# Patient Record
Sex: Male | Born: 1965 | Race: White | Hispanic: No | Marital: Married | Smoking: Never smoker
Health system: Southern US, Community
[De-identification: ages and names within clinical notes are randomized; demographics above are authoritative.]

## PROBLEM LIST (undated history)

## (undated) DIAGNOSIS — K2970 Gastritis, unspecified, without bleeding: Secondary | ICD-10-CM

## (undated) DIAGNOSIS — R002 Palpitations: Secondary | ICD-10-CM

## (undated) DIAGNOSIS — B9681 Helicobacter pylori [H. pylori] as the cause of diseases classified elsewhere: Secondary | ICD-10-CM

## (undated) DIAGNOSIS — K222 Esophageal obstruction: Secondary | ICD-10-CM

## (undated) DIAGNOSIS — R0683 Snoring: Secondary | ICD-10-CM

## (undated) DIAGNOSIS — K219 Gastro-esophageal reflux disease without esophagitis: Secondary | ICD-10-CM

## (undated) HISTORY — DX: Gastritis, unspecified, without bleeding: K29.70

## (undated) HISTORY — DX: Helicobacter pylori (H. pylori) as the cause of diseases classified elsewhere: B96.81

## (undated) HISTORY — DX: Gastro-esophageal reflux disease without esophagitis: K21.9

## (undated) HISTORY — DX: Palpitations: R00.2

## (undated) HISTORY — DX: Esophageal obstruction: K22.2

## (undated) HISTORY — PX: ESOPHAGOGASTRODUODENOSCOPY: SHX1529

## (undated) HISTORY — DX: Snoring: R06.83

---

## 1988-05-18 DIAGNOSIS — D229 Melanocytic nevi, unspecified: Secondary | ICD-10-CM

## 1988-05-18 HISTORY — DX: Melanocytic nevi, unspecified: D22.9

## 1997-11-15 ENCOUNTER — Ambulatory Visit (HOSPITAL_COMMUNITY): Admission: RE | Admit: 1997-11-15 | Discharge: 1997-11-15 | Payer: Self-pay | Admitting: Gastroenterology

## 1999-04-28 ENCOUNTER — Ambulatory Visit (HOSPITAL_COMMUNITY): Admission: RE | Admit: 1999-04-28 | Discharge: 1999-04-28 | Payer: Self-pay | Admitting: Gastroenterology

## 1999-05-29 ENCOUNTER — Ambulatory Visit (HOSPITAL_COMMUNITY): Admission: RE | Admit: 1999-05-29 | Discharge: 1999-05-29 | Payer: Self-pay | Admitting: Gastroenterology

## 2000-06-17 ENCOUNTER — Encounter: Payer: Self-pay | Admitting: Family Medicine

## 2000-06-17 ENCOUNTER — Encounter: Admission: RE | Admit: 2000-06-17 | Discharge: 2000-06-17 | Payer: Self-pay | Admitting: Family Medicine

## 2000-11-19 ENCOUNTER — Encounter: Payer: Self-pay | Admitting: Family Medicine

## 2000-11-19 ENCOUNTER — Encounter: Admission: RE | Admit: 2000-11-19 | Discharge: 2000-11-19 | Payer: Self-pay | Admitting: Family Medicine

## 2004-01-14 ENCOUNTER — Ambulatory Visit (HOSPITAL_COMMUNITY): Admission: RE | Admit: 2004-01-14 | Discharge: 2004-01-14 | Payer: Self-pay | Admitting: Gastroenterology

## 2004-03-07 ENCOUNTER — Encounter: Admission: RE | Admit: 2004-03-07 | Discharge: 2004-03-07 | Payer: Self-pay | Admitting: Family Medicine

## 2004-08-11 ENCOUNTER — Encounter (INDEPENDENT_AMBULATORY_CARE_PROVIDER_SITE_OTHER): Payer: Self-pay | Admitting: *Deleted

## 2004-08-11 ENCOUNTER — Ambulatory Visit (HOSPITAL_BASED_OUTPATIENT_CLINIC_OR_DEPARTMENT_OTHER): Admission: RE | Admit: 2004-08-11 | Discharge: 2004-08-11 | Payer: Self-pay | Admitting: Surgery

## 2009-01-24 ENCOUNTER — Ambulatory Visit: Payer: Self-pay | Admitting: Internal Medicine

## 2009-01-24 DIAGNOSIS — K219 Gastro-esophageal reflux disease without esophagitis: Secondary | ICD-10-CM

## 2009-01-24 DIAGNOSIS — R0989 Other specified symptoms and signs involving the circulatory and respiratory systems: Secondary | ICD-10-CM

## 2009-01-24 DIAGNOSIS — R002 Palpitations: Secondary | ICD-10-CM | POA: Insufficient documentation

## 2009-01-24 DIAGNOSIS — R0609 Other forms of dyspnea: Secondary | ICD-10-CM

## 2009-01-25 ENCOUNTER — Encounter: Payer: Self-pay | Admitting: Internal Medicine

## 2009-01-31 ENCOUNTER — Ambulatory Visit: Payer: Self-pay

## 2009-01-31 ENCOUNTER — Telehealth: Payer: Self-pay | Admitting: Internal Medicine

## 2009-01-31 ENCOUNTER — Encounter: Payer: Self-pay | Admitting: Internal Medicine

## 2009-02-18 LAB — CONVERTED CEMR LAB
ALT: 24 units/L (ref 0–53)
AST: 17 units/L (ref 0–37)
Albumin: 4.6 g/dL (ref 3.5–5.2)
Alkaline Phosphatase: 44 units/L (ref 39–117)
BUN: 15 mg/dL (ref 6–23)
Basophils Absolute: 0 10*3/uL (ref 0.0–0.1)
Basophils Relative: 1 % (ref 0–1)
Bilirubin, Direct: 0.2 mg/dL (ref 0.0–0.3)
CO2: 22 meq/L (ref 19–32)
Calcium: 9.6 mg/dL (ref 8.4–10.5)
Chloride: 104 meq/L (ref 96–112)
Cholesterol: 152 mg/dL (ref 0–200)
Creatinine, Ser: 0.91 mg/dL (ref 0.40–1.50)
Eosinophils Absolute: 0.4 10*3/uL (ref 0.0–0.7)
Eosinophils Relative: 5 % (ref 0–5)
Free T4: 0.95 ng/dL (ref 0.80–1.80)
Glucose, Bld: 88 mg/dL (ref 70–99)
HCT: 45.5 % (ref 39.0–52.0)
HDL: 41 mg/dL (ref >39)
Hemoglobin: 16.3 g/dL (ref 13.0–17.0)
Indirect Bilirubin: 1.1 mg/dL — ABNORMAL HIGH (ref 0.0–0.9)
LDL Cholesterol: 84 mg/dL (ref 0–99)
Lymphocytes Relative: 36 % (ref 12–46)
Lymphs Abs: 2.5 10*3/uL (ref 0.7–4.0)
MCHC: 35.8 g/dL (ref 30.0–36.0)
MCV: 89.6 fL (ref 78.0–100.0)
Monocytes Absolute: 0.5 10*3/uL (ref 0.1–1.0)
Monocytes Relative: 8 % (ref 3–12)
Neutro Abs: 3.6 10*3/uL (ref 1.7–7.7)
Neutrophils Relative %: 51 % (ref 43–77)
Platelets: 219 10*3/uL (ref 150–400)
Potassium: 4.3 meq/L (ref 3.5–5.3)
RBC: 5.08 M/uL (ref 4.22–5.81)
RDW: 12.2 % (ref 11.5–15.5)
Sodium: 140 meq/L (ref 135–145)
TSH: 2.628 microintl units/mL (ref 0.350–4.500)
Total Bilirubin: 1.3 mg/dL — ABNORMAL HIGH (ref 0.3–1.2)
Total CHOL/HDL Ratio: 3.7
Total Protein: 6.9 g/dL (ref 6.0–8.3)
Triglycerides: 134 mg/dL (ref <150)
VLDL: 27 mg/dL (ref 0–40)
WBC: 7 10*3/uL (ref 4.0–10.5)

## 2009-03-11 ENCOUNTER — Encounter: Payer: Self-pay | Admitting: Cardiology

## 2009-03-11 ENCOUNTER — Ambulatory Visit (HOSPITAL_BASED_OUTPATIENT_CLINIC_OR_DEPARTMENT_OTHER): Admission: RE | Admit: 2009-03-11 | Discharge: 2009-03-11 | Payer: Self-pay | Admitting: Internal Medicine

## 2009-03-11 ENCOUNTER — Encounter: Payer: Self-pay | Admitting: Internal Medicine

## 2009-03-18 ENCOUNTER — Ambulatory Visit: Payer: Self-pay | Admitting: Pulmonary Disease

## 2009-03-18 ENCOUNTER — Telehealth: Payer: Self-pay | Admitting: Internal Medicine

## 2009-03-23 ENCOUNTER — Ambulatory Visit: Payer: Self-pay | Admitting: Cardiology

## 2009-03-23 ENCOUNTER — Encounter: Payer: Self-pay | Admitting: Cardiology

## 2009-03-23 ENCOUNTER — Ambulatory Visit: Payer: Self-pay | Admitting: Internal Medicine

## 2009-03-23 DIAGNOSIS — B070 Plantar wart: Secondary | ICD-10-CM

## 2009-03-30 ENCOUNTER — Telehealth: Payer: Self-pay | Admitting: Internal Medicine

## 2009-05-09 ENCOUNTER — Telehealth (INDEPENDENT_AMBULATORY_CARE_PROVIDER_SITE_OTHER): Payer: Self-pay | Admitting: *Deleted

## 2009-08-11 ENCOUNTER — Ambulatory Visit: Payer: Self-pay | Admitting: Internal Medicine

## 2009-08-11 DIAGNOSIS — A048 Other specified bacterial intestinal infections: Secondary | ICD-10-CM | POA: Insufficient documentation

## 2009-08-12 ENCOUNTER — Encounter (INDEPENDENT_AMBULATORY_CARE_PROVIDER_SITE_OTHER): Payer: Self-pay | Admitting: *Deleted

## 2009-08-12 ENCOUNTER — Telehealth: Payer: Self-pay | Admitting: Internal Medicine

## 2009-09-20 ENCOUNTER — Telehealth: Payer: Self-pay | Admitting: Internal Medicine

## 2010-08-01 NOTE — Letter (Signed)
Summary: New Patient letter  [facility]  [address] [place], [place] [address]   Phone: [phone]  Fax: [phone]       08/12/2009 MRN: [id number]  Joseph Gallagher [address] [place], [place]  [address]  Dear Mr. Joseph Gallagher,  Welcome to the Gastroenterology Division at [facility].    You are scheduled to see Dr. Russella Dar on 09/07/2009 at 8:45AM on the 3rd floor at [facility], [address]  We ask that you try to arrive at our office 15 minutes prior to your appointment time to allow for check-in.  We would like you to complete the enclosed self-administered evaluation form prior to your visit and bring it with you on the day of your appointment.  We will review it with you.  Also, please bring a complete list of all your medications or, if you prefer, bring the medication bottles and we will list them.  Please bring your insurance card so that we may make a copy of it.  If your insurance requires a referral to see a specialist, please bring your referral form from your primary care physician.  Co-payments are due at the time of your visit and may be paid by cash, check or credit card.     Your office visit will consist of a consult with your physician (includes a physical exam), any laboratory testing he/she may order, scheduling of any necessary diagnostic testing (e.g. x-ray, ultrasound, CT-scan), and scheduling of a procedure (e.g. Endoscopy, Colonoscopy) if required.  Please allow enough time on your schedule to allow for any/all of these possibilities.    If you cannot keep your appointment, please call [phone] to cancel or reschedule prior to your appointment date.  This allows [place] the opportunity to schedule an appointment for another patient in need of care.  If you do not cancel or reschedule by 5 p.m. the business day prior to your appointment date, you will be charged a $50.00 late cancellation/no-show fee.    Thank you for  choosing [facility] for your medical needs.  We appreciate the opportunity to care for you.  Please visit [place] at our website  to learn more about our practice.                     Sincerely,                                                             The Gastroenterology Division

## 2010-08-01 NOTE — Assessment & Plan Note (Signed)
Summary: NEW PT CPX-[employer] INSUR//CH   Vital Signs:  Patient profile:   45 year old male Height:      72 inches Weight:      222.75 pounds BMI:     30.32 Temp:     98.1 degrees F rectal Pulse rate:   72 / minute Pulse rhythm:   regular Resp:     16 per minute BP sitting:   118 / 72  (right arm) Cuff size:   large  Vitals Entered By: Glendell Docker CMA (January 24, 2009 1:31 PM)  Primary Care Provider:  Dondra Spry DO  CC:  New Patient -CPX and Palpitations.  History of Present Illness: New Patient to establish care CPX  Palpitations      This is a 45 year old man who presents with Palpitations.  The patient denies dizziness, presyncope, syncope, chest pain, and shortness of breath.  The patient denies the following symptoms: weakness, shortness of breath, and weight loss.  The palpitations are described as a sensation of the heart skipping beats and the heart beating strongly.  The palpitations are intermittent.  The palpitations are worse with caffeine use.    Preventive Screening-Counseling & Management  Alcohol-Tobacco     Alcohol drinks/day: 1 beer once a week      Alcohol type: beer     Alcohol Counseling: not indicated; use of alcohol is not excessive or problematic     Smoking Status: never     Tobacco Counseling: not indicated; no tobacco use  Caffeine-Diet-Exercise     Caffeine use/day: none     Caffeine Counseling: not indicated; caffeine use is not excessive or problematic     Does Patient Exercise: yes     Times/week: 3  Allergies (verified): No Known Drug Allergies  Past History:  Past Medical History: GERD Hx of arrhythmia Hx of esophageal stricture 2005 (Dr. Randa Evens)  Past Surgical History: EGD and salvary dilatation 12/2003 (Dr. 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         All other systems were reviewed and were negative.   Physical Exam  General:  alert, well-developed, and well-nourished.   Neck:  supple, no masses, no thyromegaly, and no carotid bruits.   Lungs:  normal respiratory effort, normal breath sounds, and no wheezes.   Heart:  normal rate, regular rhythm, no murmur, and no gallop.   Abdomen:  soft, non-tender, normal bowel sounds, no hepatomegaly, and no splenomegaly.   Extremities:  No lower extremity edema  Neurologic:  cranial nerves II-XII intact and gait normal.   Psych:  normally interactive, good eye contact, not anxious appearing, and not depressed appearing.     Impression & Recommendations:  Problem # 1:  HEALTH MAINTENANCE EXAM (ICD-V70.0)  Reviewed adult health maintenance protocols.  Td Booster: Tdap (01/02/2007)     Problem # 2:  PALPITATIONS, RECURRENT (ICD-785.1) Arrange further work up.  I suspect benign etiology.  I advised avoid all caffeine.   Orders: T-Basic Metabolic Panel [phone]) T-Lipid Profile [phone]) T-Hepatic Function [phone]) T-CBC w/Diff [phone]) T-TSH [id number]) T-T4, Free [phone]) Echo Referral (Echo)  Problem # 3:  SNORING (ICD-786.09)  Orders: Sleep Disorder Referral (Sleep Disorder)  Complete Medication List: 1)  Probiotic Caps (Misc intestinal flora regulat) .... Take 1 capsule by mouth once a day 2)  Aspirin Ec Low Dose 81 Mg Tbec (Aspirin) .... Take 1 tablet by mouth once a day 3)  Vitamin D3 2000 Unit Caps (Cholecalciferol) .... 2 caps by mouth once daily  Patient Instructions: 1)  Please schedule a follow-up appointment in 1 month.  Current Allergies (reviewed today): No known allergies    Preventive  Care Screening  Last Tetanus Booster:    Date:  01/02/2007    Results:  Tdap   PPD:    Date:  01/16/2005    Results:  negative

## 2010-08-01 NOTE — Progress Notes (Signed)
Summary: Re: F/u appt. with Dr.Shyane Fossum & H. Pylori test info.  Phone Note Call from Patient   Caller: Patient Summary of Call: I was going to set Joseph Gallagher up a f/u appt. with Dr.Smayan Hackbart, but patient wants to wait to find out when H-Pylori test is going to be. He was asking how and when that would be done & if someone would call and set that up for him.? I told him I would call him back with that info. and then set up a f/u appt.. Thank you, Victorino Dike Initial call taken by: Michaelle Copas,  August 12, 2009 10:21 AM  Follow-up for Phone Call        noted.  check status of GI referral for H Pylori breath test Follow-up by: D. Thomos Lemons DO,  August 12, 2009 12:30 PM  Additional Follow-up for Phone Call Additional follow up Details #1::        Pt. wants to wait and schedule f/u appt. with Dr.Anise Harbin until after he gets results from H Pylori.... Referral appt. has been set up for this test. Additional Follow-up by: Michaelle Copas,  August 12, 2009 2:34 PM

## 2010-08-01 NOTE — Assessment & Plan Note (Signed)
Summary: HEART BURN/HEA   Vital Signs:  Patient profile:   45 year old male Height:      72 inches Weight:      225.75 pounds BMI:     30.73 O2 Sat:      99 % on Room air Temp:     97.9 degrees F oral Pulse rate:   86 / minute BP sitting:   114 / 70  (right arm)  Vitals Entered By: Lucious Groves (August 11, 2009 4:13 PM)  O2 Flow:  Room air CC: C/O heartburn that is worse at night and has been causing chest discomfort and problems sleeping. Pt denies fever, and N&V./kb, Heartburn Comments Patient does have previous hx of H. Pylori infection. OTC items have been minimal help./kb   Primary Care Provider:  DThomos Lemons DO  CC:  C/O heartburn that is worse at night and has been causing chest discomfort and problems sleeping. Pt denies fever, and N&V./kb, and Heartburn.  History of Present Illness:  Heartburn      This is a 45 year old man who presents with Heartburn.  The patient reports acid reflux, but denies trouble swallowing and weight loss.  The patient denies the following alarm features: melena.  Symptoms are worse with spicy foods, aspirin, and lying down.   he has hx of h pylori infection diagnosed by antibody test he was has had EGDs in the past showing gastritis and esophagitis he was treated with 2 abx and ppi x 2 weeks years ago reflux symptoms getting worse recently he stopped PPI due to concerns of side effects  limited caffeine intake no soft drinks he drinks decaf tea no nsaid use but use aspirin  Current Medications (verified): 1)  Probiotic  Caps (Misc Intestinal Flora Regulat) .... Take 1 Capsule By Mouth Once A Day 2)  Aspirin Ec Low Dose 81 Mg Tbec (Aspirin) .... Take 1 Tablet By Mouth Once A Day 3)  Vitamin D3 2000 Unit Caps (Cholecalciferol) .... 2 Caps By Mouth Once Daily  Allergies (verified): No Known Drug Allergies  Past History:  Past Medical History: Current Problems:  PALPITATIONS, RECURRENT (ICD-785.1) SNORING (ICD-786.09) HEALTH  MAINTENANCE EXAM (ICD-V70.0)  FAMILY HISTORY BREAST CANCER 1ST DEGREE RELATIVE <50 (ICD-V16.3) GERD (ICD-530.81) Hx of esophageal stricture 2005 (Dr. Randa Evens) Hx of H. Pylori gastritis  (treated)  Past Surgical History: EGD and salvary dilatation 12/2003 (Dr. 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   Lungs:  normal respiratory effort and normal breath sounds.   Heart:  normal rate, regular rhythm, and no gallop.   Abdomen:  soft, non-tender, normal bowel sounds, no hepatomegaly, and no splenomegaly.     Impression & Recommendations:  Problem # 1:  HELICOBACTER PYLORI GASTRITIS (ICD-041.86) Pt was diagnosed with h. pylori by antibody test by his previous PCP.  he was treated x 2 weeks.  reflux symptoms improved but he is experiencing exacerbation.  Check breath test to see pt reinfected or tx failure.    restart nexium antireflux handout provided stop aspirin therapy  Orders: Misc. Referral (Misc. Ref)  Problem # 2:  PALPITATIONS, RECURRENT (ICD-785.1) never completed holter.  machine stopped working after 6 hrs.  prev echo - Left ventricle: The cavity  size was normal. Systolic function was     normal. The estimated ejection fraction was in the range of 55% to     60%. Wall motion was normal; there were no regional wall motion     abnormalities.   sleep study showed AIVR.   pt has infrequent symptoms.   continue to avoid caffeine.  consider b blocker if symptoms get worse  Complete Medication List: 1)  Probiotic Caps (Misc intestinal flora regulat) .... Take 1 capsule by mouth once a day 2)  Vitamin D3 2000 Unit Caps (Cholecalciferol) .... 2 caps by mouth once daily 3)  Nexium 40  Mg Cpdr (Esomeprazole magnesium) .... One by mouth once daily 15-30 mins before am meal  Patient Instructions: 1)  Please schedule a follow-up appointment in 2 months. Prescriptions: NEXIUM 40 MG CPDR (ESOMEPRAZOLE MAGNESIUM) one by mouth once daily 15-30 mins before AM meal  #30 x 3   Entered and Authorized by:   D. Thomos Lemons DO   Signed by:   D. Thomos Lemons DO on 08/11/2009   Method used:   Electronically to        [facility] [address] (retail)       [address] [place]       [place], [place]  [address]       Ph: [phone]       Fax: [phone]   RxID:   [id number]

## 2010-08-01 NOTE — Progress Notes (Signed)
  Phone Note Call from Patient   Caller: Patient Details for Reason: stopped caffine Summary of Call: Patient has stopped all caffine and heart burn has  improved  almost completely   wants to cancel appt with GI  Dr Russella Dar  call pt back   [phone] Initial call taken by: Darral Dash,  September 20, 2009 12:07 PM  Follow-up for Phone Call        noted Follow-up by: D. Thomos Lemons DO,  September 20, 2009 12:51 PM  Additional Follow-up for Phone Call Additional follow up Details #1::        Call to Pt      Appt cancelled    Additional Follow-up by: Darral Dash,  September 21, 2009 9:45 AM

## 2010-11-17 NOTE — Op Note (Signed)
NAME:  Joseph Gallagher, Joseph Gallagher                        ACCOUNT NO.:  [id number]   MEDICAL RECORD NO.:  [id number]                   PATIENT TYPE:  AMB   LOCATION:  [facility]                                 FACILITY:  [facility]   PHYSICIAN:  James L. Malon Kindle., M.D.          DATE OF BIRTH:  Jul 04, 1965   DATE OF PROCEDURE:  01/14/2004  DATE OF DISCHARGE:                                 OPERATIVE REPORT   PROCEDURE:  Esophagogastroduodenoscopy and Savary dilatation.   MEDICATIONS:  Cetacaine spray, fentanyl 100 mcg, Versed 10 mg IV.   INDICATIONS FOR PROCEDURE:  The patient has a previous history of esophageal  stricture with some increasing dysphagia.  This is done due to this known  history of previous stricture.   DESCRIPTION OF PROCEDURE:  The procedure had been explained to the patient  and consent obtained.  With the patient in the left lateral decubitus  position, the Olympus scope was inserted and advanced.  The distal esophagus  was reached.  The patient gagged and wretched despite a large amount of  sedation.  He had a stricture above the small hiatal hernia.  The scope  easily passed.  A complete endoscopy was performed and was completely  normal.  The duodenal bulb and second duodenum were normal. The pyloric  channel and antrum were normal.  Fundus and cardia were seen well in the  retroflexed view and were normal.  The scope along the greater curve of the  stomach, the Savary guide wire was placed through the scope and the scope  withdrawn under fluoroscopic guidance over the guide wire.  With the  patient's head extended, I then placed 33 and 36 Savary dilators were  passed.  There was a small amount of heme with both dilators.  The 36  dilator and wire were withdrawn as a unit.  The scope was withdrawn.  The  patient tolerated the procedure well and there were no immediate  complications.   ASSESSMENT:  Esophageal stricture dilated to 36 French.  530.3  Plan routine  post dilatation  orders.  Will keep on a PPI daily.  Give a reflux  instruction sheet and see back in the office in three months.                                               James L. Malon Kindle., M.D.    Waldron Session  D:  01/14/2004  T:  01/14/2004  Job:  [id number]   cc:   L. Lupe Carney, M.D.  [address] [place]  [place] [address]  Fax: [phone]

## 2010-11-21 ENCOUNTER — Telehealth: Payer: Self-pay | Admitting: Internal Medicine

## 2010-11-21 DIAGNOSIS — K219 Gastro-esophageal reflux disease without esophagitis: Secondary | ICD-10-CM

## 2010-11-21 DIAGNOSIS — Z0000 Encounter for general adult medical examination without abnormal findings: Secondary | ICD-10-CM

## 2010-11-21 NOTE — Telephone Encounter (Signed)
He is off on 5-25 and would like to get his cpe labs at [facility] that day   I had to make his cpe appt out on June 16.  Please fax order

## 2010-11-24 NOTE — Telephone Encounter (Signed)
BMET, FLP - V70 Magnesium level - use GERD code

## 2010-11-24 NOTE — Telephone Encounter (Signed)
Lab orders entered  Call placed to patient at [phone], no answer. A detailed voice message was left informing patient lab orders have been placed.

## 2010-12-11 ENCOUNTER — Encounter: Payer: Self-pay | Admitting: Internal Medicine

## 2010-12-14 ENCOUNTER — Encounter: Payer: Self-pay | Admitting: Internal Medicine

## 2010-12-14 ENCOUNTER — Encounter: Payer: Self-pay | Admitting: Family Medicine

## 2010-12-18 ENCOUNTER — Other Ambulatory Visit: Payer: Self-pay | Admitting: Dermatology

## 2011-02-16 ENCOUNTER — Ambulatory Visit (INDEPENDENT_AMBULATORY_CARE_PROVIDER_SITE_OTHER): Payer: 59 | Admitting: Internal Medicine

## 2011-02-16 ENCOUNTER — Encounter: Payer: Self-pay | Admitting: Internal Medicine

## 2011-02-16 VITALS — BP 124/80 | HR 57 | Temp 97.9°F | Resp 16 | Ht 72.0 in | Wt 228.0 lb

## 2011-02-16 DIAGNOSIS — Z0000 Encounter for general adult medical examination without abnormal findings: Secondary | ICD-10-CM

## 2011-02-16 LAB — URINALYSIS
Hgb urine dipstick: NEGATIVE
Ketones, ur: NEGATIVE mg/dL
Nitrite: NEGATIVE
Protein, ur: NEGATIVE mg/dL
Urobilinogen, UA: 0.2 mg/dL (ref 0.0–1.0)

## 2011-02-16 LAB — CBC WITH DIFFERENTIAL/PLATELET
Basophils Relative: 0 % (ref 0–1)
HCT: 45.3 % (ref 39.0–52.0)
Hemoglobin: 16.4 g/dL (ref 13.0–17.0)
Lymphocytes Relative: 29 % (ref 12–46)
MCHC: 36.2 g/dL — ABNORMAL HIGH (ref 30.0–36.0)
Monocytes Absolute: 0.5 10*3/uL (ref 0.1–1.0)
Monocytes Relative: 9 % (ref 3–12)
Neutro Abs: 3.1 10*3/uL (ref 1.7–7.7)
Neutrophils Relative %: 56 % (ref 43–77)
RBC: 4.97 MIL/uL (ref 4.22–5.81)
WBC: 5.6 10*3/uL (ref 4.0–10.5)

## 2011-02-16 LAB — LIPID PANEL
Cholesterol: 130 mg/dL (ref 0–200)
LDL Cholesterol: 72 mg/dL (ref 0–99)
Total CHOL/HDL Ratio: 3.3 Ratio
Triglycerides: 90 mg/dL (ref <150)
VLDL: 18 mg/dL (ref 0–40)

## 2011-02-16 LAB — BASIC METABOLIC PANEL
Chloride: 104 mEq/L (ref 96–112)
Glucose, Bld: 95 mg/dL (ref 70–99)
Potassium: 4.1 mEq/L (ref 3.5–5.3)
Sodium: 140 mEq/L (ref 135–145)

## 2011-02-16 LAB — HEPATIC FUNCTION PANEL
AST: 20 U/L (ref 0–37)
Albumin: 4.5 g/dL (ref 3.5–5.2)
Alkaline Phosphatase: 46 U/L (ref 39–117)
Total Protein: 6.6 g/dL (ref 6.0–8.3)

## 2011-02-16 MED ORDER — CEPHALEXIN 500 MG PO CAPS: 500.0000 mg | ORAL_CAPSULE | Freq: Three times a day (TID) | ORAL | Status: AC

## 2011-02-16 NOTE — Patient Instructions (Signed)
Attempt valerian over the counter for sleep

## 2011-02-17 DIAGNOSIS — Z0000 Encounter for general adult medical examination without abnormal findings: Secondary | ICD-10-CM | POA: Insufficient documentation

## 2011-02-17 LAB — VITAMIN D 25 HYDROXY (VIT D DEFICIENCY, FRACTURES): Vit D, 25-Hydroxy: 30 ng/mL (ref 30–89)

## 2011-02-17 NOTE — Progress Notes (Signed)
  Subjective:    Patient ID: Joseph Gallagher, male    DOB: Jan 30, 1966, 45 y.o.   MRN: [id number]  HPI Pt presents to clinic for annual physical. Sustained laceration to right thumb 2 d ago. No bleeding, f/c or drainage. +surrounding erythema and st swelling. FROM of thumb. Has chronic insomnia s/p reportedly nl sleep study. Failed melatonin and otc sleep aids. No other complaints.  Past Medical History  Diagnosis Date  . Palpitations     recurrent  . Snoring   . GERD (gastroesophageal reflux disease)   . Esophageal stricture     history of  2005 ( Dr Carman Ching)  . Helicobacter pylori gastritis     treated   Past Surgical History  Procedure Date  . Esophagogastroduodenoscopy     and salavary dilatation 12/2003- Dr Carman Ching    reports that he has never smoked. He has never used smokeless tobacco. He reports that he drinks alcohol. He reports that he does not use illicit drugs. family history includes Breast cancer in an unspecified family member.  There is no history of Other. No Known Allergies     Review of Systems see hpi     Objective:   Physical Exam   Physical Exam  Nursing note and vitals reviewed. Constitutional: Appears well-developed and well-nourished. No distress.  HENT: perrl, eom grossly intact. Op clear. Head: Normocephalic and atraumatic.  Right Ear: External ear normal. Nl tm and canal Left Ear: External ear normal. nl tm and canal Eyes: Conjunctivae are normal. No scleral icterus.  Neck: Neck supple. Carotid bruit is not present.  Cardiovascular: Normal rate, regular rhythm and normal heart sounds.  Exam reveals no gallop and no friction rub.   No murmur heard. Pulmonary/Chest: Effort normal and breath sounds normal. No respiratory distress. He has no wheezes. no rales.  Abd: soft, nd, nt,+bs, no masses or organomegaly. Lymphadenopathy:    He has no cervical adenopathy.  Neurological:Alert.  Skin: Skin is warm and dry. Not diaphoretic.    Psychiatric: Has a normal mood and affect.      Assessment & Plan:

## 2011-02-17 NOTE — Assessment & Plan Note (Signed)
Nl exam. Obtain cpe labs. Attempt valerian otc for insomnia. Begin 7d course of keflex for thumb cellulitis. Tetanus utd. Followup if no improvement or worsening.

## 2011-03-07 ENCOUNTER — Other Ambulatory Visit: Payer: Self-pay | Admitting: Internal Medicine

## 2011-11-05 ENCOUNTER — Other Ambulatory Visit: Payer: Self-pay | Admitting: Dermatology

## 2012-02-18 ENCOUNTER — Encounter: Payer: 59 | Admitting: Internal Medicine

## 2012-02-22 ENCOUNTER — Encounter: Payer: 59 | Admitting: Internal Medicine

## 2012-04-07 ENCOUNTER — Encounter: Payer: Self-pay | Admitting: Internal Medicine

## 2012-04-07 ENCOUNTER — Ambulatory Visit (INDEPENDENT_AMBULATORY_CARE_PROVIDER_SITE_OTHER): Payer: Managed Care, Other (non HMO) | Admitting: Internal Medicine

## 2012-04-07 VITALS — BP 112/68 | HR 62 | Temp 98.2°F | Wt 240.0 lb

## 2012-04-07 DIAGNOSIS — N419 Inflammatory disease of prostate, unspecified: Secondary | ICD-10-CM

## 2012-04-07 MED ORDER — LEVOFLOXACIN 500 MG PO TABS: 500.0000 mg | ORAL_TABLET | Freq: Every day | ORAL | Status: DC

## 2012-04-07 NOTE — Patient Instructions (Addendum)
Please schedule fasting labs prior to your next physical Cbc, chem7, lipid, lft, tsh and psa-v70.0

## 2012-04-08 LAB — URINALYSIS, ROUTINE W REFLEX MICROSCOPIC
Hgb urine dipstick: NEGATIVE
Leukocytes, UA: NEGATIVE
Nitrite: NEGATIVE
Protein, ur: NEGATIVE mg/dL

## 2012-04-09 DIAGNOSIS — R350 Frequency of micturition: Secondary | ICD-10-CM | POA: Insufficient documentation

## 2012-04-09 LAB — URINE CULTURE: Colony Count: NO GROWTH

## 2012-04-09 NOTE — Progress Notes (Signed)
  Subjective:    Patient ID: Joseph Gallagher, male    DOB: Apr 13, 1966, 46 y.o.   MRN: [id number]  HPI patient presents to clinic for evaluation of urinary changes. approximate 2-3 week history of increased urinary frequency and nocturia. Denies fever chills, hematuria, dysuria or abdominal pain. Does have vague discomfort in the pelvic area it does not radiate. No alleviating or exacerbating factors. Taking no medication for the problem.  Past Medical History  Diagnosis Date  . Palpitations     recurrent  . Snoring   . GERD (gastroesophageal reflux disease)   . Esophageal stricture     history of  2005 ( Dr Carman Ching)  . Helicobacter pylori gastritis     treated   Past Surgical History  Procedure Date  . Esophagogastroduodenoscopy     and salavary dilatation 12/2003- Dr Carman Ching    reports that he has never smoked. He has never used smokeless tobacco. He reports that he drinks alcohol. He reports that he does not use illicit drugs. family history includes Breast cancer in an unspecified family member.  There is no history of Other. No Known Allergies   Review of Systems see history of present illness     Objective:   Physical Exam  Nursing note and vitals reviewed. Constitutional: He appears well-developed and well-nourished. No distress.  HENT:  Head: Normocephalic and atraumatic.  Eyes: No scleral icterus.  Neurological: He is alert.  Skin: He is not diaphoretic.  Psychiatric: He has a normal mood and affect.          Assessment & Plan:

## 2012-04-09 NOTE — Assessment & Plan Note (Signed)
Suspect prostatitis based on history. Obtain urinalysis and urine culture. Attempt ten-day course of Levaquin. Follow closely if no improvement or worsening.

## 2012-04-11 ENCOUNTER — Telehealth: Payer: Self-pay | Admitting: *Deleted

## 2012-04-11 NOTE — Telephone Encounter (Signed)
Pt called requesting results from urine test. Pt's urine cx came back negative for bacterial growth. Contacted pt and left a message on cell phone with results. Told pt to continue with his Levoquin therapy, if his sx did not change or became worse to contact our office.

## 2012-04-22 ENCOUNTER — Telehealth: Payer: Self-pay | Admitting: *Deleted

## 2012-04-22 DIAGNOSIS — N419 Inflammatory disease of prostate, unspecified: Secondary | ICD-10-CM

## 2012-04-22 MED ORDER — LEVOFLOXACIN 500 MG PO TABS: 500.0000 mg | ORAL_TABLET | Freq: Every day | ORAL | Status: DC

## 2012-04-22 NOTE — Telephone Encounter (Signed)
Ok to repeat abx course

## 2012-04-22 NOTE — Telephone Encounter (Signed)
Pt notified ok to repeat  Levoquin 1 tab po qd x 10 days. Rx sent to [facility] on [address].

## 2012-04-22 NOTE — Telephone Encounter (Signed)
Pt called stating that he has taken the 10 days of Levoquin. Still has sx, wants to know if he can get another 10 days of rx. If so, can we call into the [facility] on [facility] [address] [facility].

## 2012-04-25 ENCOUNTER — Ambulatory Visit (INDEPENDENT_AMBULATORY_CARE_PROVIDER_SITE_OTHER): Payer: Managed Care, Other (non HMO) | Admitting: Internal Medicine

## 2012-04-25 ENCOUNTER — Encounter: Payer: Self-pay | Admitting: Internal Medicine

## 2012-04-25 VITALS — BP 118/76 | HR 65 | Temp 98.1°F | Resp 12 | Ht 73.5 in | Wt 237.0 lb

## 2012-04-25 DIAGNOSIS — Z0000 Encounter for general adult medical examination without abnormal findings: Secondary | ICD-10-CM

## 2012-04-25 DIAGNOSIS — N419 Inflammatory disease of prostate, unspecified: Secondary | ICD-10-CM

## 2012-04-25 DIAGNOSIS — R21 Rash and other nonspecific skin eruption: Secondary | ICD-10-CM

## 2012-04-25 DIAGNOSIS — Z23 Encounter for immunization: Secondary | ICD-10-CM

## 2012-04-25 LAB — CBC WITH DIFFERENTIAL/PLATELET
Basophils Absolute: 0 10*3/uL (ref 0.0–0.1)
Basophils Relative: 1 % (ref 0–1)
Eosinophils Absolute: 0.6 10*3/uL (ref 0.0–0.7)
Eosinophils Relative: 11 % — ABNORMAL HIGH (ref 0–5)
MCH: 32.7 pg (ref 26.0–34.0)
MCHC: 36.4 g/dL — ABNORMAL HIGH (ref 30.0–36.0)
MCV: 89.8 fL (ref 78.0–100.0)
Platelets: 210 10*3/uL (ref 150–400)
RDW: 13.6 % (ref 11.5–15.5)

## 2012-04-25 LAB — HEPATIC FUNCTION PANEL
AST: 22 U/L (ref 0–37)
Albumin: 4.2 g/dL (ref 3.5–5.2)
Alkaline Phosphatase: 44 U/L (ref 39–117)
Total Bilirubin: 0.8 mg/dL (ref 0.3–1.2)

## 2012-04-25 LAB — BASIC METABOLIC PANEL
CO2: 27 mEq/L (ref 19–32)
Calcium: 9.1 mg/dL (ref 8.4–10.5)
Creat: 0.86 mg/dL (ref 0.50–1.35)

## 2012-04-25 LAB — LIPID PANEL
Cholesterol: 146 mg/dL (ref 0–200)
HDL: 38 mg/dL — ABNORMAL LOW (ref >39)
Triglycerides: 117 mg/dL (ref <150)

## 2012-04-25 MED ORDER — TRIAMCINOLONE ACETONIDE 0.1 % EX CREA: TOPICAL_CREAM | Freq: Two times a day (BID) | CUTANEOUS | Status: DC

## 2012-04-25 NOTE — Assessment & Plan Note (Signed)
Attempt triamcinolone to affected area. Followup if no improvement or worsening.

## 2012-04-25 NOTE — Patient Instructions (Addendum)
Please schedule fasting labs prior to next year's physical Cbc, chem7, lipid, lft, tsh, ua with reflex micro and psa-v70.0

## 2012-04-25 NOTE — Assessment & Plan Note (Signed)
Symptoms currently resolved with second course of antibiotic. Followup if symptoms recur

## 2012-04-25 NOTE — Progress Notes (Signed)
  Subjective:    Patient ID: Joseph Gallagher, male    DOB: [birth year], 46 y.o.   MRN: [id number]  HPI patient presents to clinic for annual physical. Recently treated for presumed prostatitis with course of Levaquin. Symptoms of urinary frequency nocturia and pelvic discomfort resolved with antibiotic. Several days after completion however symptoms began to return mildly had a second course of antibiotic was begun. Symptoms currently resolved. Notes chronic intermittent cracking and thickening of skin involving lateral edges of fingers as well as inflammation of umbilicus. No other rashes noted and no extensor surface involvement.  Past Medical History  Diagnosis Date  . Palpitations     recurrent  . Snoring   . GERD (gastroesophageal reflux disease)   . Esophageal stricture     history of  2005 ( Dr Carman Ching)  . Helicobacter pylori gastritis     treated   Past Surgical History  Procedure Date  . Esophagogastroduodenoscopy     and salavary dilatation 12/2003- Dr Carman Ching    reports that he has never smoked. He has never used smokeless tobacco. He reports that he does not drink alcohol or use illicit drugs. family history includes Breast cancer in an unspecified family member.  There is no history of Other. No Known Allergies   Review of Systems see history of present illness     Objective:   Physical Exam  Nursing note and vitals reviewed. Constitutional: He appears well-developed and well-nourished. No distress.  HENT:  Head: Normocephalic and atraumatic.  Right Ear: Tympanic membrane, external ear and ear canal normal.  Left Ear: Tympanic membrane, external ear and ear canal normal.  Nose: Nose normal.  Mouth/Throat: Oropharynx is clear and moist. No oropharyngeal exudate.  Eyes: Conjunctivae normal and EOM are normal. Pupils are equal, round, and reactive to light. No scleral icterus.  Neck: Neck supple. No thyromegaly present.  Cardiovascular: Normal rate,  regular rhythm, normal heart sounds and intact distal pulses.  Exam reveals no gallop and no friction rub.   No murmur heard. Pulmonary/Chest: Effort normal and breath sounds normal. No respiratory distress. He has no wheezes. He has no rales.  Abdominal: Soft. Bowel sounds are normal. He exhibits no distension and no mass. There is no hepatosplenomegaly. There is no tenderness. There is no rebound and no guarding.  Lymphadenopathy:    He has no cervical adenopathy.  Neurological: He is alert.  Skin: Skin is warm and dry. He is not diaphoretic.       Umbilicus mildly inflamed without drainage  Psychiatric: He has a normal mood and affect.          Assessment & Plan:

## 2012-04-25 NOTE — Assessment & Plan Note (Signed)
Normal exam. EKG obtained (repeated because of lead placement) demonstrates sinus bradycardia at a rate of 60. Normal intervals and axis. No evidence of acute ischemic change. Obtain CPE labs.

## 2012-04-28 ENCOUNTER — Ambulatory Visit (INDEPENDENT_AMBULATORY_CARE_PROVIDER_SITE_OTHER): Payer: Managed Care, Other (non HMO) | Admitting: Internal Medicine

## 2012-04-28 DIAGNOSIS — Z111 Encounter for screening for respiratory tuberculosis: Secondary | ICD-10-CM

## 2012-04-28 NOTE — Progress Notes (Signed)
  Subjective:    Patient ID: Joseph Gallagher, male    DOB: 06-23-1966, 46 y.o.   MRN: [id number]  HPI  The patient presented to the office after 48 hours for evaluation of PPD skin test placed on 04/25/12.  Review of Systems     Objective:   Physical Exam  No redness or induration noted.       Assessment & Plan:   Negative TB skin test.

## 2012-04-30 DIAGNOSIS — Z23 Encounter for immunization: Secondary | ICD-10-CM

## 2012-05-07 ENCOUNTER — Encounter: Payer: Self-pay | Admitting: *Deleted

## 2012-05-09 ENCOUNTER — Telehealth: Payer: Self-pay | Admitting: *Deleted

## 2012-05-09 DIAGNOSIS — N419 Inflammatory disease of prostate, unspecified: Secondary | ICD-10-CM

## 2012-05-09 MED ORDER — LEVOFLOXACIN 500 MG PO TABS: 500.0000 mg | ORAL_TABLET | Freq: Every day | ORAL | Status: DC

## 2012-05-09 NOTE — Telephone Encounter (Signed)
Pt requesting one more round of ABX [last Rx: Levaquin 500 mg [1] daily #10x0 10.22.13] for the weekend, states he has done [2] rounds already and is better; to [facility] pharmacy W-S/SLS Please advise.

## 2012-05-09 NOTE — Telephone Encounter (Signed)
Rx to pharmacy; pt informed/SLS 

## 2012-05-09 NOTE — Telephone Encounter (Signed)
Ok

## 2012-09-23 ENCOUNTER — Ambulatory Visit: Payer: Managed Care, Other (non HMO) | Admitting: Family

## 2012-09-23 ENCOUNTER — Telehealth: Payer: Self-pay | Admitting: Family

## 2012-09-23 ENCOUNTER — Encounter: Payer: Self-pay | Admitting: Family

## 2012-09-23 ENCOUNTER — Ambulatory Visit (HOSPITAL_BASED_OUTPATIENT_CLINIC_OR_DEPARTMENT_OTHER)
Admission: RE | Admit: 2012-09-23 | Discharge: 2012-09-23 | Disposition: A | Discharge status: Home / Self Care | Payer: Managed Care, Other (non HMO) | Source: Ambulatory Visit | Attending: Family | Admitting: Family

## 2012-09-23 ENCOUNTER — Telehealth: Payer: Self-pay | Admitting: *Deleted

## 2012-09-23 ENCOUNTER — Ambulatory Visit (INDEPENDENT_AMBULATORY_CARE_PROVIDER_SITE_OTHER): Payer: Managed Care, Other (non HMO) | Admitting: Family

## 2012-09-23 VITALS — BP 120/80 | HR 84 | Temp 99.8°F | Resp 18 | Wt 237.0 lb

## 2012-09-23 DIAGNOSIS — R05 Cough: Secondary | ICD-10-CM

## 2012-09-23 DIAGNOSIS — J322 Chronic ethmoidal sinusitis: Secondary | ICD-10-CM

## 2012-09-23 DIAGNOSIS — B070 Plantar wart: Secondary | ICD-10-CM

## 2012-09-23 DIAGNOSIS — R059 Cough, unspecified: Secondary | ICD-10-CM | POA: Insufficient documentation

## 2012-09-23 MED ORDER — BENZONATATE 100 MG PO CAPS: 100.0000 mg | ORAL_CAPSULE | Freq: Three times a day (TID) | ORAL | Status: DC | PRN

## 2012-09-23 MED ORDER — AMOXICILLIN-POT CLAVULANATE 875-125 MG PO TABS: 1.0000 | ORAL_TABLET | Freq: Two times a day (BID) | ORAL | Status: DC

## 2012-09-23 MED ORDER — PREDNISONE 10 MG PO TABS: ORAL_TABLET | ORAL | Status: DC

## 2012-09-23 MED ORDER — AMOXICILLIN-POT CLAVULANATE 600-42.9 MG/5ML PO SUSR: 600.0000 mg | Freq: Two times a day (BID) | ORAL | Status: DC

## 2012-09-23 NOTE — Assessment & Plan Note (Signed)
cxr negative for pneumonia. Suspect sinusitis which is aggravating cough.  Add short course of prednisone for cough and tessalon. rx Augmentin for sinusitis

## 2012-09-23 NOTE — Telephone Encounter (Signed)
New Rx phoned in to pharmacist, Dorene Sorrow, at [facility] with instructions on equivalent dosing [from oral to suspension]/SLS

## 2012-09-23 NOTE — Patient Instructions (Addendum)
Please complete chest x ray on the first floor. Call if symptoms worsen or do not improve in 3 days. Follow up in 1 week.

## 2012-09-23 NOTE — Telephone Encounter (Signed)
Reviewed CXR.  Negative for pneumonia. Plan rx with augmentin.  Pt aware and requests all rx be sent to [facility].  Cancelled rx at [facility] pharmacy.

## 2012-09-23 NOTE — Telephone Encounter (Signed)
OK to substitute liquid.

## 2012-09-23 NOTE — Progress Notes (Signed)
Subjective:    Patient ID: Joseph Gallagher, male    DOB: 11-Aug-1965, 47 y.o.   MRN: [id number]  HPI  Mr. Vitelli is a 47 yr old male who presents today with chief complaint of cough. Cough has been present x 14 days. He reports that symptoms started out like a cold with congestion.  Had associated laryngitis.  Resolved and then came back with a "vengence."  Cough is dry and hacking.  Non-productive. He does have some green mucous in his sinuses.  + pain "in my eye sockets."  He reports that he had a subjective fever this week which he described as chills. Did not check temperature.  He tried a family member's cough syrup with hydrocodone.   He has a plantar wart at the base of the right heel  Which he wishes to have frozen.   Review of Systems See HPI  Past Medical History  Diagnosis Date  . Palpitations     recurrent  . Snoring   . GERD (gastroesophageal reflux disease)   . Esophageal stricture     history of  2005 ( Dr Carman Ching)  . Helicobacter pylori gastritis     treated    History   Social History  . Marital Status: Married    Spouse Name: N/A    Number of Children: N/A  . Years of Education: N/A   Occupational History  . Not on file.   Social History Main Topics  . Smoking status: Never Smoker   . Smokeless tobacco: Never Used  . Alcohol Use: No  . Drug Use: No  . Sexually Active: Not on file   Other Topics Concern  . Not on file   Social History Narrative   Occupation:Plumber   Married 2.5 years (wife having issues with infertility)   She is neonatal PA   no children   Never Smoked    Alcohol use-yes          Past Surgical History  Procedure Laterality Date  . Esophagogastroduodenoscopy      and salavary dilatation 12/2003- Dr Carman Ching    Family History  Problem Relation Age of Onset  . Breast cancer    . Other Neg Hx     no preamature CAD    No Known Allergies  Current Outpatient Prescriptions on File Prior to Visit   Medication Sig Dispense Refill  . aspirin 81 MG tablet Take 81 mg by mouth daily.      . Cholecalciferol (VITAMIN D3) 2000 UNITS TABS Take by mouth. 2 capsules by mouth once daily      . Probiotic Product (PROBIOTIC ACIDOPHILUS PO) Take by mouth.         No current facility-administered medications on file prior to visit.    BP 120/80  Pulse 84  Temp(Src) 99.8 F (37.7 C) (Oral)  Resp 18  Wt 237 lb (107.502 kg)  BMI 30.84 kg/m2  SpO2 96%       Objective:   Physical Exam  Constitutional: He is oriented to person, place, and time. He appears well-developed and well-nourished. No distress.  HENT:  Head: Normocephalic and atraumatic.  Right Ear: Tympanic membrane and ear canal normal.  Left Ear: Tympanic membrane and ear canal normal.  Mouth/Throat: No oropharyngeal exudate, posterior oropharyngeal edema or posterior oropharyngeal erythema.  Cardiovascular: Normal rate and regular rhythm.   No murmur heard. Pulmonary/Chest: Effort normal and breath sounds normal. No respiratory distress. He has no wheezes. He has no rales.  He exhibits no tenderness.  Lymphadenopathy:    He has cervical adenopathy.  Neurological: He is alert and oriented to person, place, and time.  Psychiatric: He has a normal mood and affect. His behavior is normal. Judgment and thought content normal.  skin- small plantar wart noted on right inner heel.        Assessment & Plan:

## 2012-09-23 NOTE — Telephone Encounter (Signed)
Received message from pt that he forgot to mention he has difficulty swallowing larger pills and is requesting Rx for liquid augmentin to replace the tablets he was prescribed today.  Please advise.

## 2012-09-29 ENCOUNTER — Ambulatory Visit: Payer: Managed Care, Other (non HMO) | Admitting: Family

## 2012-10-08 ENCOUNTER — Encounter: Payer: Self-pay | Admitting: Internal Medicine

## 2012-10-08 ENCOUNTER — Ambulatory Visit (INDEPENDENT_AMBULATORY_CARE_PROVIDER_SITE_OTHER): Payer: Managed Care, Other (non HMO) | Admitting: Internal Medicine

## 2012-10-08 VITALS — BP 124/76 | HR 72 | Temp 98.1°F | Ht 73.5 in | Wt 233.0 lb

## 2012-10-08 DIAGNOSIS — R350 Frequency of micturition: Secondary | ICD-10-CM

## 2012-10-08 DIAGNOSIS — R1031 Right lower quadrant pain: Secondary | ICD-10-CM

## 2012-10-08 LAB — CBC WITH DIFFERENTIAL/PLATELET
Basophils Absolute: 0.1 10*3/uL (ref 0.0–0.1)
Basophils Relative: 0.7 % (ref 0.0–3.0)
Eosinophils Absolute: 0.3 10*3/uL (ref 0.0–0.7)
HCT: 44.8 % (ref 39.0–52.0)
Hemoglobin: 15.3 g/dL (ref 13.0–17.0)
Lymphs Abs: 2.1 10*3/uL (ref 0.7–4.0)
MCHC: 34 g/dL (ref 30.0–36.0)
MCV: 92.3 fl (ref 78.0–100.0)
Monocytes Absolute: 0.5 10*3/uL (ref 0.1–1.0)
Neutro Abs: 4.7 10*3/uL (ref 1.4–7.7)
RBC: 4.86 Mil/uL (ref 4.22–5.81)
RDW: 12.6 % (ref 11.5–14.6)

## 2012-10-08 LAB — POCT URINALYSIS DIPSTICK
Ketones, UA: NEGATIVE
Protein, UA: NEGATIVE
Spec Grav, UA: 1.025
Urobilinogen, UA: 0.2
pH, UA: 7

## 2012-10-08 LAB — BASIC METABOLIC PANEL
CO2: 30 mEq/L (ref 19–32)
Chloride: 101 mEq/L (ref 96–112)
Glucose, Bld: 88 mg/dL (ref 70–99)
Sodium: 138 mEq/L (ref 135–145)

## 2012-10-08 NOTE — Assessment & Plan Note (Signed)
Patient experiencing unexplained bouts of urinary frequency. His symptoms presumed secondary to prostatitis. He finished 2 courses of Levaquin. Patient had recurrence of symptoms recently but improved after taking Augmentin and prednisone. Unclear whether patient's symptoms are secondary to chronic prostatitis vs other etiology. Digital rectal exam is normal. He has unexplained right lower quadrant pain. Check CBCD and sedimentation rate. If abnormal, we discussed obtaining CT of abdomen and pelvis to rule out chronic appendicitis.

## 2012-10-08 NOTE — Progress Notes (Signed)
Subjective:    Patient ID: Joseph Gallagher, male    DOB: April 20, 1966, 47 y.o.   MRN: [id number]  HPI  47 year old white male with history of palpitations, gastroesophageal reflux disease and H. Pylori gastritis to reestablish. Patient was seen by Dr. Rodena Medin in October of 2013 secondary complaints of frequent urination. Presumptive diagnosis of prostatitis was made. His PSA was normal. Patient reports being treated with Levaquin for 10 days. His symptoms initially improved and then restarted so he was prescribed Levaquin for additional 10 days. His symptoms seem to get better for several months but recently his symptoms restarted.   He was seen by nurse practitioner for possible sinusitis. He was treated with combination of Augmentin and prednisone. He experience severe diarrhea while taking Augmentin. Fortunately his diarrhea resolved on its own. It has been 4 days since his last episode.  His urinary complaints also improved.  He is not experiencing any dysuria along with urinary frequency. He did not have any problems starting or stopping history. He reports mild dribbling at the end of voiding.  Review of Systems   Constitutional: Negative for activity change, appetite change and unexpected weight change. negative for fever or chills Eyes: Negative for visual disturbance.  Respiratory: Negative for cough, chest tightness and shortness of breath.   Cardiovascular: Negative for chest pain.  Genitourinary: Negative for painful urination.  Neurological: Negative for headaches.  Gastrointestinal: intermittent RLQ abdominal pain  Psych: Negative for depression or anxiety      Past Medical History  Diagnosis Date  . Palpitations     recurrent  . Snoring   . GERD (gastroesophageal reflux disease)   . Esophageal stricture     history of  2005 ( Dr Carman Ching)  . Helicobacter pylori gastritis     treated    History   Social History  . Marital Status: Married    Spouse Name: N/A     Number of Children: N/A  . Years of Education: N/A   Occupational History  . Not on file.   Social History Main Topics  . Smoking status: Never Smoker   . Smokeless tobacco: Never Used  . Alcohol Use: No  . Drug Use: No  . Sexually Active: Not on file   Other Topics Concern  . Not on file   Social History Narrative   Occupation:Plumber   Married 2.5 years (wife having issues with infertility)   She is neonatal PA   no children   Never Smoked    Alcohol use-yes          Past Surgical History  Procedure Laterality Date  . Esophagogastroduodenoscopy      and salavary dilatation 12/2003- Dr Carman Ching    Family History  Problem Relation Age of Onset  . Breast cancer    . Other Neg Hx     no preamature CAD    No Known Allergies  Current Outpatient Prescriptions on File Prior to Visit  Medication Sig Dispense Refill  . aspirin 81 MG tablet Take 81 mg by mouth daily.      . Cholecalciferol (VITAMIN D3) 2000 UNITS TABS Take by mouth. 2 capsules by mouth once daily      . Probiotic Product (PROBIOTIC ACIDOPHILUS PO) Take by mouth.         No current facility-administered medications on file prior to visit.    BP 124/76  Pulse 72  Temp(Src) 98.1 F (36.7 C) (Oral)  Ht 6' 1.5" (1.867 m)  Wt  233 lb (105.688 kg)  BMI 30.32 kg/m2       Objective:   Physical Exam  Constitutional: He is oriented to person, place, and time. He appears well-developed and well-nourished.  HENT:  Head: Normocephalic and atraumatic.  Right Ear: External ear normal.  Left Ear: External ear normal.  Mouth/Throat: Oropharynx is clear and moist.  Neck: Neck supple.  Cardiovascular: Normal rate, regular rhythm and normal heart sounds.   Pulmonary/Chest: Effort normal and breath sounds normal. He has no wheezes.  Abdominal: Soft. Bowel sounds are normal. He exhibits no mass. There is no rebound.  RLQ tenderness  Genitourinary: Guaiac negative stool.  External hemorrhoids,  prostate is normal size and contour, no asymmetry or nodules  Lymphadenopathy:    He has no cervical adenopathy.  Neurological: He is alert and oriented to person, place, and time. No cranial nerve deficit.  Skin: Skin is warm and dry.  Psychiatric: He has a normal mood and affect. His behavior is normal.          Assessment & Plan:

## 2012-10-08 NOTE — Patient Instructions (Addendum)
Contact our office if your urinary symptoms recur or you develop abdominal pain and fever

## 2012-10-09 ENCOUNTER — Ambulatory Visit: Payer: Managed Care, Other (non HMO) | Admitting: Family Medicine

## 2012-10-09 ENCOUNTER — Encounter: Payer: Self-pay | Admitting: Internal Medicine

## 2013-05-07 ENCOUNTER — Other Ambulatory Visit: Payer: Self-pay

## 2013-05-18 ENCOUNTER — Other Ambulatory Visit: Payer: Self-pay | Admitting: Dermatology

## 2013-05-18 DIAGNOSIS — C439 Malignant melanoma of skin, unspecified: Secondary | ICD-10-CM

## 2013-05-18 HISTORY — DX: Malignant melanoma of skin, unspecified: C43.9

## 2013-06-19 ENCOUNTER — Other Ambulatory Visit: Payer: Self-pay | Admitting: Dermatology

## 2013-10-18 IMAGING — CR DG CHEST 2V
2 series · 2 of 2 positions shown · non-contrast
Comparison: None.

CLINICAL DATA: 14-day history of cough

CHEST - 2 VIEW

[w chest pa]
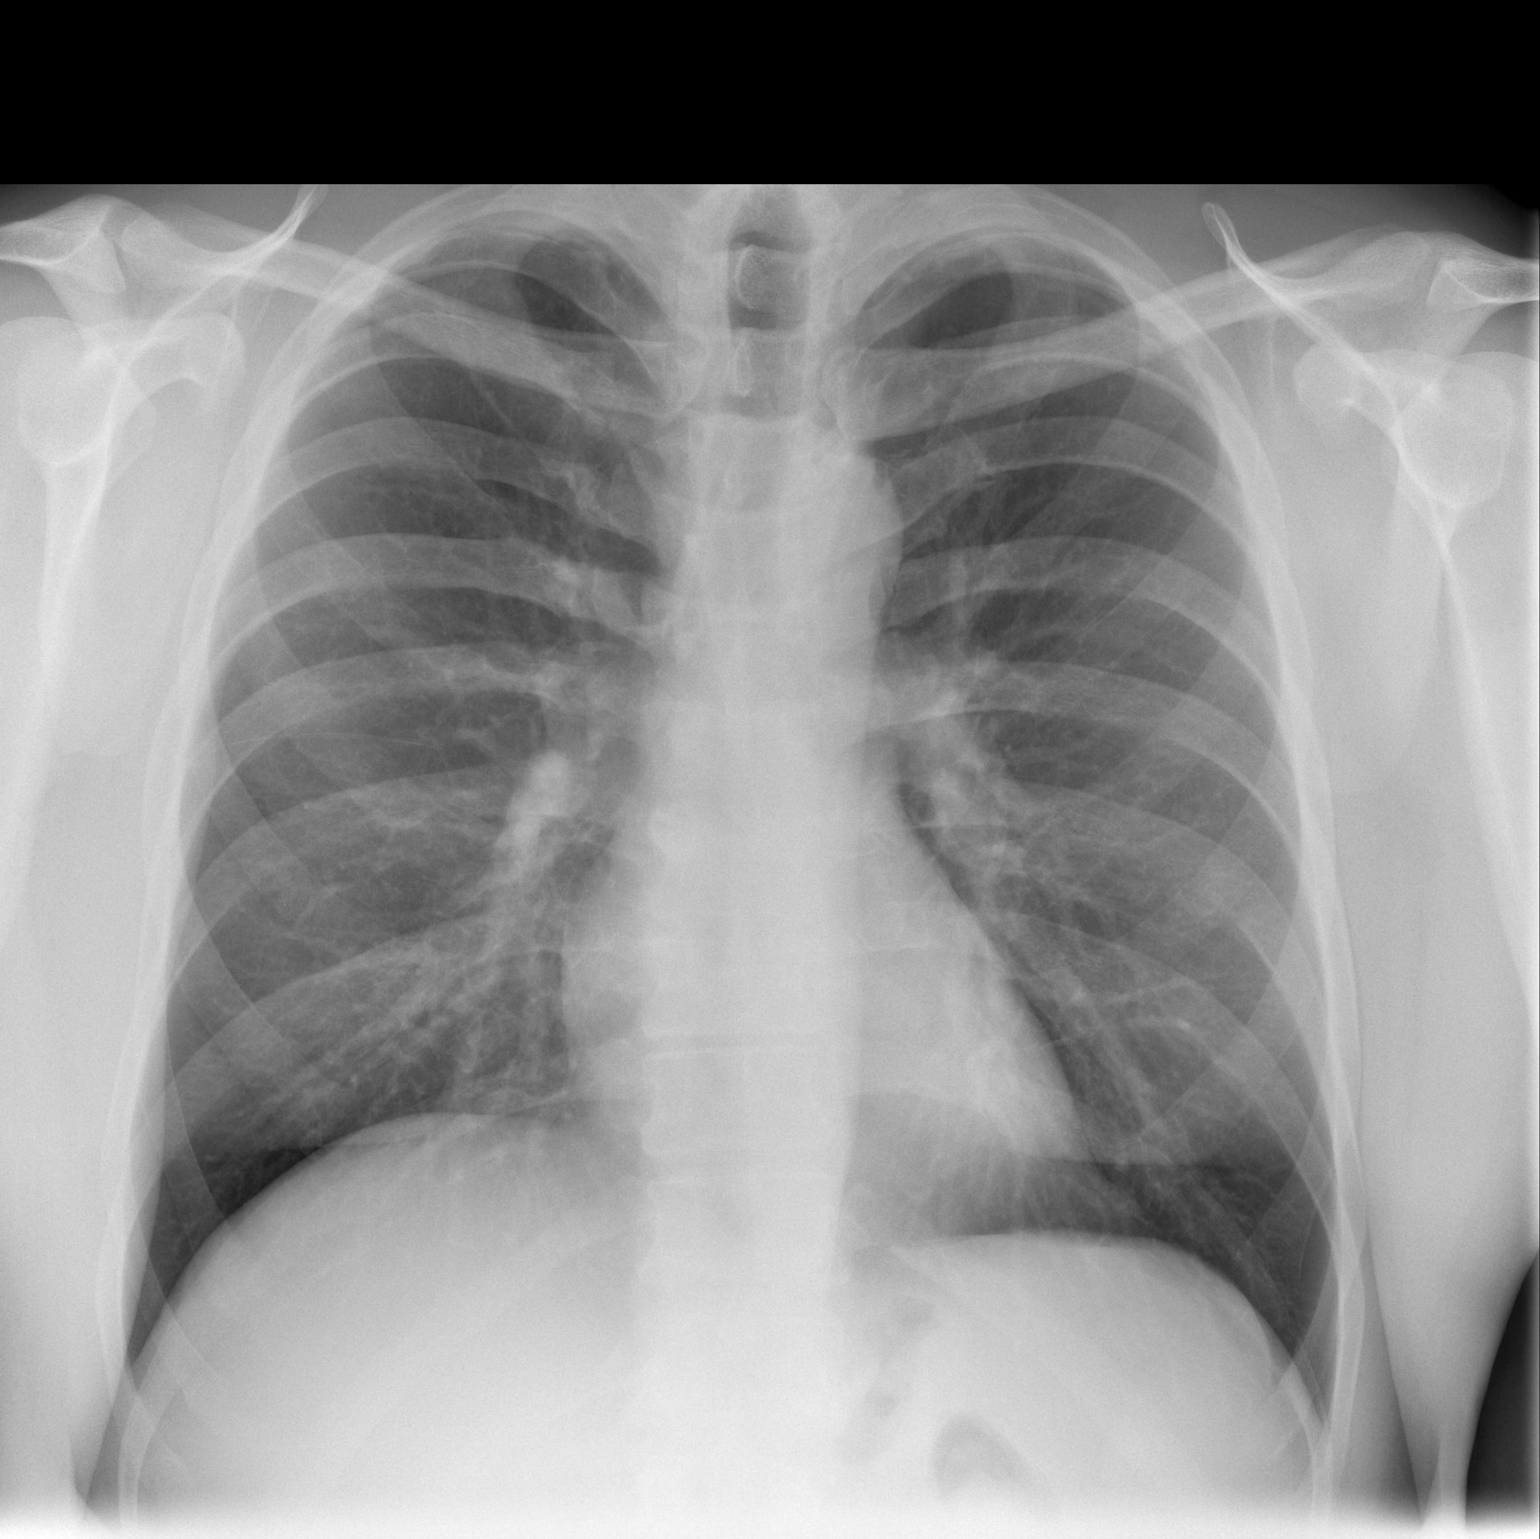

[w chest lat]
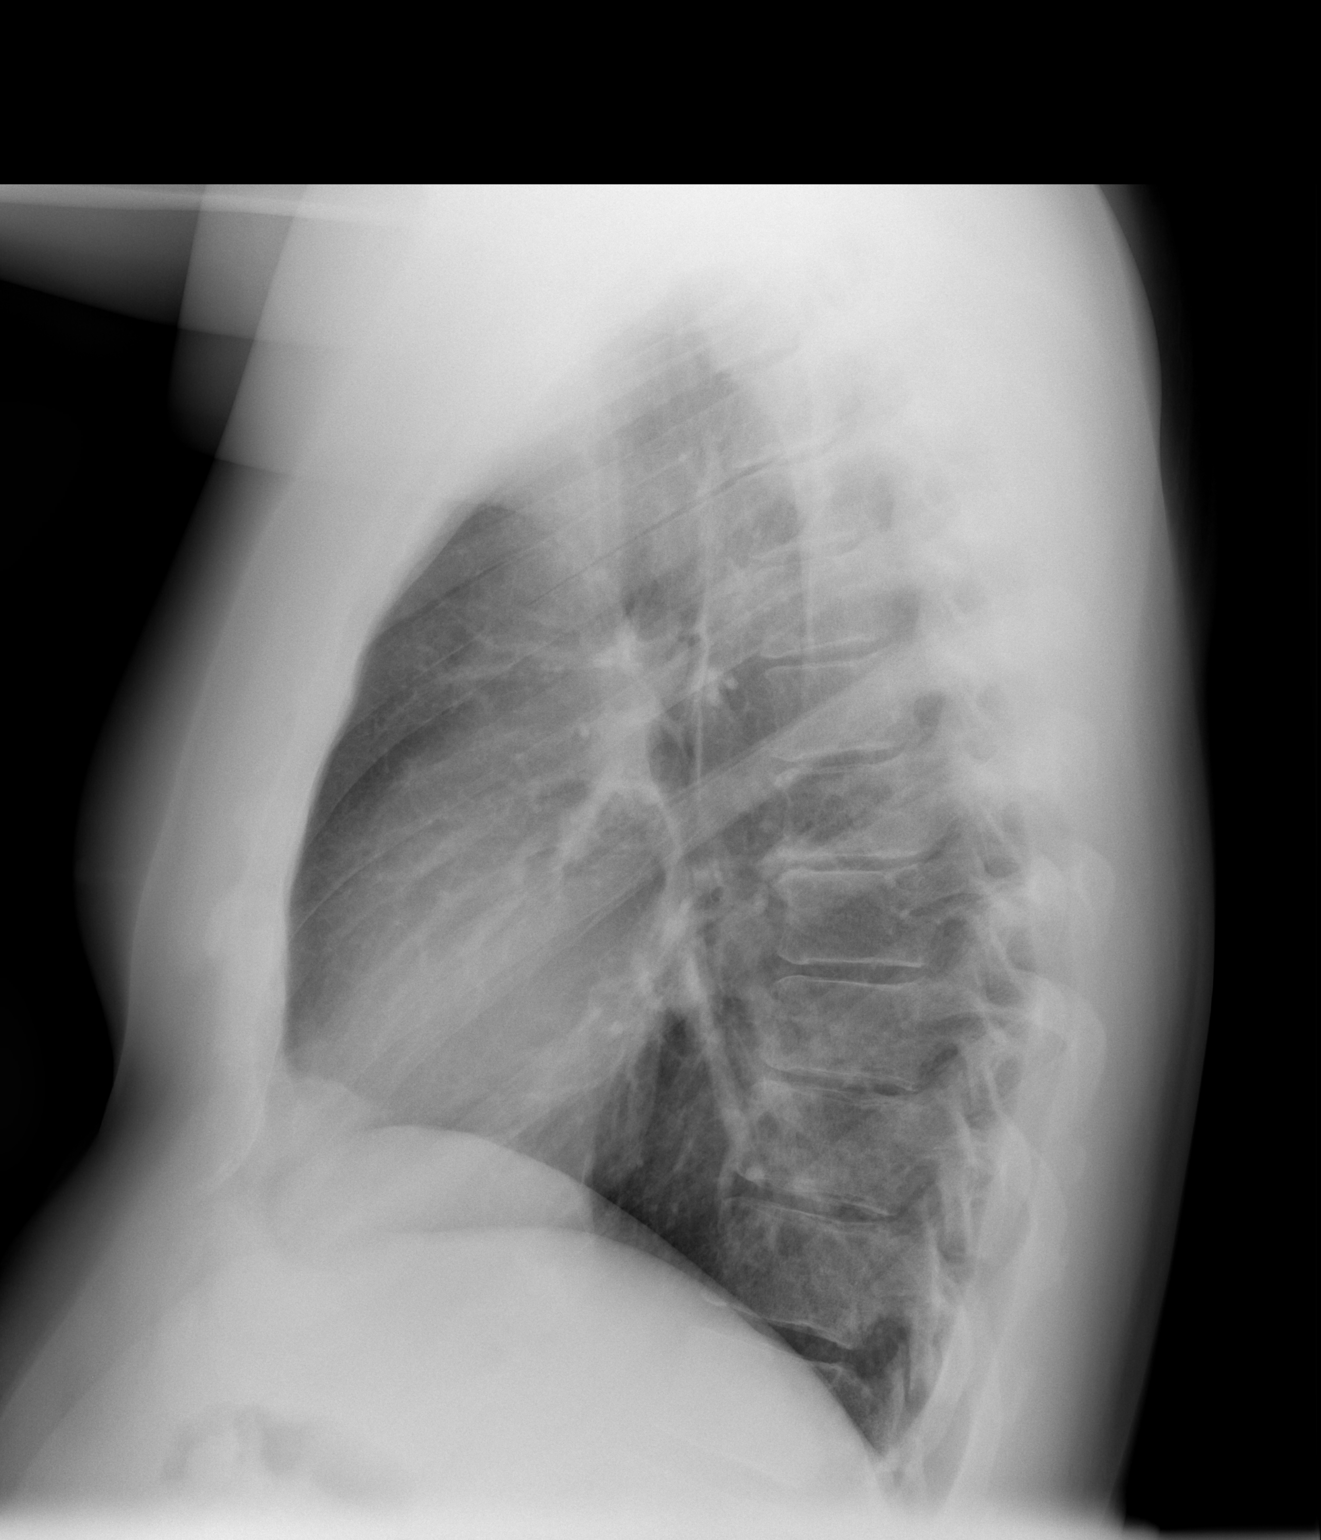

[2 of 2 positions shown; findings below may reference images not displayed]

FINDINGS: The heart size and mediastinal contours are within normal
limits.  Both lungs are clear.  The visualized skeletal structures
are unremarkable.
IMPRESSION: Negative exam.

## 2013-12-07 ENCOUNTER — Other Ambulatory Visit: Payer: Self-pay | Admitting: Dermatology

## 2014-02-04 ENCOUNTER — Other Ambulatory Visit: Payer: Self-pay | Admitting: Dermatology

## 2014-06-01 ENCOUNTER — Other Ambulatory Visit: Payer: Self-pay | Admitting: Dermatology

## 2014-09-23 ENCOUNTER — Other Ambulatory Visit: Payer: Self-pay | Admitting: Dermatology

## 2015-04-26 ENCOUNTER — Other Ambulatory Visit: Payer: Self-pay | Admitting: Dermatology

## 2019-12-09 ENCOUNTER — Encounter: Payer: Self-pay | Admitting: *Deleted

## 2019-12-14 ENCOUNTER — Other Ambulatory Visit: Payer: Self-pay

## 2019-12-14 ENCOUNTER — Ambulatory Visit (INDEPENDENT_AMBULATORY_CARE_PROVIDER_SITE_OTHER): Payer: Managed Care, Other (non HMO) | Admitting: Dermatology

## 2019-12-14 DIAGNOSIS — Z85820 Personal history of malignant melanoma of skin: Secondary | ICD-10-CM | POA: Diagnosis not present

## 2019-12-14 DIAGNOSIS — Z1283 Encounter for screening for malignant neoplasm of skin: Secondary | ICD-10-CM

## 2019-12-14 DIAGNOSIS — Z86018 Personal history of other benign neoplasm: Secondary | ICD-10-CM | POA: Diagnosis not present

## 2019-12-14 DIAGNOSIS — D485 Neoplasm of uncertain behavior of skin: Secondary | ICD-10-CM | POA: Diagnosis not present

## 2019-12-14 NOTE — Patient Instructions (Addendum)
Biopsy, Surgery (Curettage) & Surgery (Excision) Aftercare Instructions  1. Okay to remove bandage in 24 hours  2. Wash area with soap and water  3. Apply Vaseline to area twice daily until healed (Not Neosporin)  4. Okay to cover with a Band-Aid to decrease the chance of infection or prevent irritation from clothing; also it's okay to uncover lesion at home.  5. Suture instructions: return to our office in 7-10 or 10-14 days for a nurse visit for suture removal. Variable healing with sutures, if pain or itching occurs call our office. It's okay to shower or bathe 24 hours after sutures are given.  6. The following risks may occur after a biopsy, curettage or excision: bleeding, scarring, discoloration, recurrence, infection (redness, yellow drainage, pain or swelling).  7. For questions, concerns and results call our office at [facility] before 4pm & Friday before 3pm. Biopsy results will be available in 1 week.   Lipoma  A lipoma is a noncancerous (benign) tumor that is made up of fat cells. This is a very common type of soft-tissue growth. Lipomas are usually found under the skin (subcutaneous). They may occur in any tissue of the body that contains fat. Common areas for lipomas to appear include the back, arms, shoulders, buttocks, and thighs. Lipomas grow slowly, and they are usually painless. Most lipomas do not cause problems and do not require treatment. What are the causes? The cause of this condition is not known. What increases the risk? You are more likely to develop this condition if:  You are 54-54 years old.  You have a family history of lipomas. What are the signs or symptoms? A lipoma usually appears as a small, round bump under the skin. In most cases, the lump will:  Feel soft or rubbery.  Not cause pain or other symptoms. However, if a lipoma is located in an area where it pushes on nerves, it can become painful or cause other symptoms. How is this  diagnosed? A lipoma can usually be diagnosed with a physical exam. You may also have tests to confirm the diagnosis and to rule out other conditions. Tests may include:  Imaging tests, such as a CT scan or an MRI.  Removal of a tissue sample to be looked at under a microscope (biopsy). How is this treated? Treatment for this condition depends on the size of the lipoma and whether it is causing any symptoms.  For small lipomas that are not causing problems, no treatment is needed.  If a lipoma is bigger or it causes problems, surgery may be done to remove the lipoma. Lipomas can also be removed to improve appearance. Most often, the procedure is done after applying a medicine that numbs the area (local anesthetic).  Liposuction may be done to reduce the size of the lipoma before it is removed through surgery, or it may be done to remove the lipoma. Lipomas are removed with this method in order to limit incision size and scarring. A liposuction tube is inserted through a small incision into the lipoma, and the contents of the lipoma are removed through the tube with suction. Follow these instructions at home:  Watch your lipoma for any changes.  Keep all follow-up visits as told by your health care provider. This is important. Contact a health care provider if:  Your lipoma becomes larger or hard.  Your lipoma becomes painful, red, or increasingly swollen. These could be signs of infection or a more serious condition. Get help right away  if:  You develop tingling or numbness in an area near the lipoma. This could indicate that the lipoma is causing nerve damage. Summary  A lipoma is a noncancerous tumor that is made up of fat cells.  Most lipomas do not cause problems and do not require treatment.  If a lipoma is bigger or it causes problems, surgery may be done to remove the lipoma.  Contact a health care provider if your lipoma becomes larger or hard, or if it becomes painful, red,  or increasingly swollen. Pain, redness, and swelling could be signs of infection or a more serious condition. This information is not intended to replace advice given to you by your health care provider. Make sure you discuss any questions you have with your health care provider. Document Revised: 02/02/2019 Document Reviewed: 02/02/2019 Elsevier Patient Education  2020 [employer] Angioma A cherry angioma is a harmless growth on the skin. It is made up of blood vessels. Cherry angiomas can appear anywhere on the body, but they usually appear on the trunk and arms. What are the causes? The cause of this condition is not known, but it seems to be related to advancing age. What increases the risk? You are more likely to develop this condition if you:  Are over the age of 54.  Have a family member with this condition. What are the signs or symptoms?   Symptoms of this condition include harmless growths that are: ? Smooth, round, and red or purplish-red. ? As small as the tip of a pin or as big as a pencil eraser. How is this diagnosed? This condition is diagnosed with a skin exam. Rarely, a piece of the cherry angioma may be removed for testing if it is not clear that the growth is a cherry angioma. How is this treated? Treatment is not needed for this condition. If you do not like the way a cherry angioma looks, you may have it removed. Removal methods include:  A method where heat is used to burn the cherry angioma off the skin (electrocautery).  A method where the cherry angioma is frozen (cryosurgery). This causes it to eventually fall off the skin.  A method where a laser is used to destroy the red blood cells and blood vessels in the angioma (laser therapy).  A minor surgical procedure. A scalpel is used to remove the cherry angioma off the skin. A cherry angioma may come back after it has been removed. Follow these instructions at home:  If you have a cherry angioma  removed, keep the area clean and follow any other care instructions as told by your health care provider.  Take over-the-counter and prescription medicines only as told by your health care provider.  Keep all follow-up visits as told by your health care provider. This is important. Summary  A cherry angioma is a harmless growth on the skin that is made up of blood vessels.  Treatment is not needed for this condition.  If you do not like the way a cherry angioma looks, you may have it removed.  If you have a cherry angioma removed, follow any care instructions as told by your health care provider. This information is not intended to replace advice given to you by your health care provider. Make sure you discuss any questions you have with your health care provider. Document Revised: 01/07/2018 Document Reviewed: 01/07/2018 Elsevier Patient Education  [place].

## 2019-12-14 NOTE — Progress Notes (Signed)
Follow-Up Visit   Subjective  Joseph Gallagher is a 54 y.o. male who presents for the following: Skin Problem (Lump on back. x several months. Getting bigger. 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 ).    Isotretinoin Follow-Up Visit   Subjective  Joseph Gallagher is a 54 y.o. male who presents for the following: Skin Problem (Lump on back. x several months. Getting bigger. 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 ).  New spots Location: Torso Duration:  Quality:  Associated Signs/Symptoms: Modifying Factors:  Severity:  Timing: Context: History of dozens of atypical moles and one melanoma.  The following portions of the chart were reviewed this encounter and updated as appropriate:       Objective  Well appearing patient in no apparent distress; mood and affect are within normal limits.  A full examination was performed including scalp, head, eyes, ears, nose, lips, neck, chest, axillae, abdomen, back, buttocks, bilateral upper extremities, bilateral lower extremities, hands, feet, fingers, toes, fingernails, and toenails. All findings within normal limits unless otherwise noted below.  Objective  left mid abdomen: Pearly, 6 mm     Objective  Left outer abdomen: Pearly 6 mm     Objective  Left breast: Pearly 5 mm     Objective  Left Lower Back: Pearly 8 mm     Objective  Mid Back: No sign of recurrent or new melanoma, no atypical moles on general skin examination.  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  Specimen 1 - Surgical pathology Differential Diagnosis: angioma Check Margins: No  Left outer abdomen  Skin / nail biopsy Type of biopsy: tangential   Informed consent: discussed and consent obtained   Timeout: patient name, date of birth, surgical site, and procedure verified   Procedure prep:  Patient was prepped and draped  in usual sterile fashion Prep type:  Chlorhexidine Anesthesia: the lesion was anesthetized in a standard fashion   Anesthetic:  1% lidocaine w/ epinephrine 1-100,000 local infiltration Instrument used: flexible razor blade   Hemostasis achieved with: ferric subsulfate   Outcome: patient tolerated procedure well   Post-procedure details: wound care instructions given    Specimen 2 - Surgical pathology Differential Diagnosis: angioma Check Margins: No  Left breast  Skin / nail biopsy Type of biopsy: tangential   Informed consent: discussed and consent obtained   Timeout: patient name, date of birth, surgical site, and procedure verified   Procedure prep:  Patient was prepped and draped in usual sterile fashion Prep type:  Chlorhexidine Anesthesia: the lesion was anesthetized in a standard fashion   Anesthetic:  1% lidocaine w/ epinephrine 1-100,000 local infiltration Instrument used: flexible razor blade   Hemostasis achieved with: ferric subsulfate   Outcome: patient tolerated procedure well   Post-procedure details: wound care instructions given    Specimen 3 - Surgical pathology Differential Diagnosis: angioma Check Margins: No  Left Lower Back  Skin / nail biopsy Type of biopsy: tangential   Informed consent: discussed and consent obtained   Timeout: patient name, date of birth, surgical site, and procedure verified   Procedure prep:  Patient was prepped and draped in usual sterile fashion Prep type:  Chlorhexidine Anesthesia: the lesion was anesthetized in a standard fashion   Anesthetic:  1% lidocaine w/ epinephrine 1-100,000 local infiltration Instrument used: flexible razor blade   Hemostasis achieved with: ferric subsulfate   Outcome:  patient tolerated procedure well   Post-procedure details: wound care instructions given    Skin / nail biopsy  Specimen 4 - Surgical pathology Differential Diagnosis: angioma Check Margins: No  Personal history of malignant  melanoma of skin Mid Back  I advised Mr. Abercrombie to examine his own skin with his spouse twice annually and get it continue to see a dermatologist yearly and as needed.  Location:  Duration:  Quality:  Associated Signs/Symptoms: Modifying Factors:  Severity:  Timing: Context:   The following portions of the chart were reviewed this encounter and updated as appropriate:     Objective  Well appearing patient in no apparent distress; mood and affect are within normal limits.  A full examination was performed including scalp, head, eyes, ears, nose, lips, neck, chest, axillae, abdomen, back, buttocks, bilateral upper extremities, bilateral lower extremities, hands, feet, fingers, toes, fingernails, and toenails. All findings within normal limits unless otherwise noted below.   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  Specimen 1 - Surgical pathology Differential Diagnosis: angioma Check Margins: No  Left outer abdomen  Skin / nail biopsy Type of biopsy: tangential   Informed consent: discussed and consent obtained   Timeout: patient name, date of birth, surgical site, and procedure verified   Procedure prep:  Patient was prepped and draped in usual sterile fashion Prep type:  Chlorhexidine Anesthesia: the lesion was anesthetized in a standard fashion   Anesthetic:  1% lidocaine w/ epinephrine 1-100,000 local infiltration Instrument used: flexible razor blade   Hemostasis achieved with: ferric subsulfate   Outcome: patient tolerated procedure well   Post-procedure details: wound care instructions given    Specimen 2 - Surgical pathology Differential Diagnosis: angioma Check Margins: No  Left breast  Skin / nail biopsy Type of biopsy: tangential   Informed consent: discussed and consent obtained   Timeout: patient name, date of birth, surgical site, and procedure verified   Procedure prep:  Patient was prepped and draped in usual  sterile fashion Prep type:  Chlorhexidine Anesthesia: the lesion was anesthetized in a standard fashion   Anesthetic:  1% lidocaine w/ epinephrine 1-100,000 local infiltration Instrument used: flexible razor blade   Hemostasis achieved with: ferric subsulfate   Outcome: patient tolerated procedure well   Post-procedure details: wound care instructions given    Specimen 3 - Surgical pathology Differential Diagnosis: angioma Check Margins: No  Left Lower Back  Skin / nail biopsy Type of biopsy: tangential   Informed consent: discussed and consent obtained   Timeout: patient name, date of birth, surgical site, and procedure verified   Procedure prep:  Patient was prepped and draped in usual sterile fashion Prep type:  Chlorhexidine Anesthesia: the lesion was anesthetized in a standard fashion   Anesthetic:  1% lidocaine w/ epinephrine 1-100,000 local infiltration Instrument used: flexible razor blade   Hemostasis achieved with: ferric subsulfate   Outcome: patient tolerated procedure well   Post-procedure details: wound care instructions given    Skin / nail biopsy  Specimen 4 - Surgical pathology Differential Diagnosis: angioma Check Margins: No  Personal history of malignant melanoma of skin Mid Back  I advised Mr. Dobrowolski to examine his own skin with his spouse twice annually and get it continue to see a dermatologist yearly and as needed. 1 concern for [facility] today is an enlarging nodule on the right mid back area which his wife has noticed.  This does a soft subcutaneous 3 cm  nodule is typical of a benign fatty cyst or lipoma.  These essentially never become malignant.  Should he insist on removal he will schedule I hour and I encouraged him to check with his insurance company first.  Secondary concern more enlargement and irritation of several red papules on the lower abdomen and left back; shave biopsy done.  There will be small crusts on these areas with no  restriction in activity.  He can check on MyChart in 2 days for these results.  Annual skin examination; sooner follow-up on a as needed basis. Skin cancer screening performed today.

## 2020-01-02 ENCOUNTER — Encounter: Payer: Self-pay | Admitting: Dermatology

## 2023-10-24 ENCOUNTER — Other Ambulatory Visit: Payer: Self-pay | Admitting: Medical Genetics

## 2023-10-30 ENCOUNTER — Other Ambulatory Visit: Payer: Self-pay

## 2023-10-30 DIAGNOSIS — Z006 Encounter for examination for normal comparison and control in clinical research program: Secondary | ICD-10-CM

## 2023-11-05 LAB — GENECONNECT MOLECULAR SCREEN: Genetic Analysis Overall Interpretation: NEGATIVE
